# Patient Record
Sex: Female | Born: 1990 | Race: White | Hispanic: No | Marital: Married | Smoking: Never smoker
Health system: Southern US, Community
[De-identification: ages and names within clinical notes are randomized; demographics above are authoritative.]

---
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
## 2008-01-22 DIAGNOSIS — E739 Lactose intolerance, unspecified: Secondary | ICD-10-CM | POA: Insufficient documentation

## 2008-01-23 DIAGNOSIS — E876 Hypokalemia: Secondary | ICD-10-CM | POA: Insufficient documentation

## 2008-01-23 DIAGNOSIS — E538 Deficiency of other specified B group vitamins: Secondary | ICD-10-CM | POA: Insufficient documentation

## 2009-08-29 DIAGNOSIS — R200 Anesthesia of skin: Secondary | ICD-10-CM | POA: Insufficient documentation

## 2011-11-24 DIAGNOSIS — Z79899 Other long term (current) drug therapy: Secondary | ICD-10-CM | POA: Insufficient documentation

## 2011-11-24 DIAGNOSIS — R200 Anesthesia of skin: Secondary | ICD-10-CM | POA: Insufficient documentation

## 2012-03-21 DIAGNOSIS — I7300 Raynaud's syndrome without gangrene: Secondary | ICD-10-CM | POA: Insufficient documentation

## 2012-03-23 DIAGNOSIS — R739 Hyperglycemia, unspecified: Secondary | ICD-10-CM | POA: Insufficient documentation

## 2016-01-07 DIAGNOSIS — R5383 Other fatigue: Secondary | ICD-10-CM | POA: Insufficient documentation

## 2018-02-14 ENCOUNTER — Encounter: Payer: Self-pay | Admitting: Internal Medicine

## 2018-03-10 ENCOUNTER — Ambulatory Visit: Payer: Self-pay | Admitting: Internal Medicine

## 2018-04-28 ENCOUNTER — Ambulatory Visit: Payer: Self-pay | Admitting: Internal Medicine

## 2018-06-02 ENCOUNTER — Ambulatory Visit (INDEPENDENT_AMBULATORY_CARE_PROVIDER_SITE_OTHER): Payer: BLUE CROSS/BLUE SHIELD | Admitting: Internal Medicine

## 2018-06-02 ENCOUNTER — Encounter: Payer: Self-pay | Admitting: Internal Medicine

## 2018-06-02 ENCOUNTER — Other Ambulatory Visit: Payer: Self-pay

## 2018-06-02 VITALS — BP 110/60 | HR 77 | Temp 98.6°F | Ht 60.0 in | Wt 292.0 lb

## 2018-06-02 DIAGNOSIS — E282 Polycystic ovarian syndrome: Secondary | ICD-10-CM

## 2018-06-02 DIAGNOSIS — E039 Hypothyroidism, unspecified: Secondary | ICD-10-CM

## 2018-06-02 LAB — TSH: TSH: 0.58 u[IU]/mL (ref 0.35–4.50)

## 2018-06-02 LAB — T3, FREE: T3, Free: 3.2 pg/mL (ref 2.3–4.2)

## 2018-06-02 LAB — T4, FREE: Free T4: 0.79 ng/dL (ref 0.60–1.60)

## 2018-06-02 LAB — HEMOGLOBIN A1C: Hgb A1c MFr Bld: 5.2 % (ref 4.6–6.5)

## 2018-06-02 NOTE — Progress Notes (Signed)
Patient ID: Brittany Rush, female   DOB: [birth year], 28 y.o.   MRN: [id number]   HPI  Brittany Rush is a 28 y.o.-year-old female, referred by her PCP, Dr. [name], for management of hypothyroidism and PCOS. She saw another endocrinologist previously (Dr. Elbert Ewings)), at [facility].  Pt. has been dx with hypothyroidism in 2003 (age 9) >> on Levothyroxine 250 mcg 6/7 days, then 125 1/7 days - last dose change 12/2017.  She takes the thyroid hormone: - fasting - with water - separated by >30 min from b'fast and occas. Coffee (creamer) - no calcium, iron, PPIs, multivitamins  Of note, she has a history of lactose intolerance.  I reviewed pt's thyroid tests: 2020: TSH abnormal 08/12/2017: TSH 0.22 No results found for: TSH, FREET4, T3FREE  Pt describes: - weight gain - fatigue - cold intolerance - depression - constipation - dry skin - hair loss  Pt denies feeling nodules in neck, hoarseness, dysphagia/odynophagia, SOB with lying down.  She has + FH of thyroid disorders in: "every woman on mother's side". No FH of thyroid cancer.  No h/o radiation tx to head or neck. No recent use of iodine supplements.  She has a h/o fibromyalgia. On Neurontin.  Pt. also has a history of PCOS - dx at 28 y/o:  Fertility/Menstrual cycles: - + irregular menses - no h/o ovarian cysts - children: 0 (tried for 3 years) -she was told that she could not have children due to her PCOS.  Did not see reproductive endocrinology in the past. - miscarriages: 0 - contraception: tried OCPs >> did not work >> now NuVa RIng  Acne: - no   Hirsutism: - just on face >> sideburns and chin - plucking it  Weight gain: - lost few lbs: highest wt: 310 >> 288 at home now - now on the 21 day fixed diet  - no steroid use - + h/o weight loss meds: Victoza, Saxenda, Advocare, Phentermine - no success - Meals: - Breakfast: Boiled egg, small sausage patty/protein shake - Lunch: Meat, vegetable, starch - Dinner: Same  as lunch - Snacks: 2 snacks a day: Vegetables, fruit  - Diets tried: see above - Exercise: Walking  Treatments tried: - started Metformin 1000 mg at dinnertime  - started ~2010 - higher dose: N/D even with the ER formulation - started Spironolactone 100 mg daily ~ 2010 - did not try Vaniqa - not on OCPs  + FH of DM1 in [place]  ROS: Constitutional: + weight gain, + fatigue, no subjective hyperthermia/hypothermia Eyes: no blurry vision, no xerophthalmia ENT: no sore throat, no nodules palpated in throat, no dysphagia/odynophagia, no hoarseness Cardiovascular: no CP/SOB/palpitations/leg swelling Respiratory: no cough/SOB Gastrointestinal: no N/V/D/C/+ acid reflux - rarely  Musculoskeletal: + Both, + headaches muscle/joint aches Skin: no rashes, + hair loss, + excessive hair growth on face, + easy bruising Neurological: no tremors/numbness/tingling/dizziness Psychiatric: no depression/anxiety + Low libido  No past medical history on file.  Social History   Socioeconomic History  . Marital status: Married    Spouse name: Not on file  . Number of children: 0  . Years of education: Not on file  . Highest education level: Not on file  Occupational History  .  Administrative assistant  Social Needs  . Financial resource strain: Not on file  . Food insecurity:    Worry: Not on file    Inability: Not on file  . Transportation needs:    Medical: Not on file    Non-medical: Not on  file  Tobacco Use  . Smoking status: Never Smoker  . Smokeless tobacco: Never Used  Substance and Sexual Activity  . Alcohol use: Not on file  . Drug use: Not on file  . Sexual activity: Not on file  Lifestyle  . Physical activity:    Days per week: Not on file    Minutes per session: Not on file  . Stress: Not on file  Relationships  . Social connections:    Talks on phone: Not on file    Gets together: Not on file    Attends religious service: Not on file    Active member of club or  organization: Not on file    Attends meetings of clubs or organizations: Not on file    Relationship status: Not on file  . Intimate partner violence:    Fear of current or ex partner: Not on file    Emotionally abused: Not on file    Physically abused: Not on file    Forced sexual activity: Not on file  Other Topics Concern  . Not on file  Social History Narrative  . Not on file   Current Outpatient Medications on File Prior to Visit  Medication Sig Dispense Refill  . escitalopram (LEXAPRO) 20 MG tablet     . etonogestrel-ethinyl estradiol (NUVARING) 0.12-0.015 MG/24HR vaginal ring INSERT 1 RING VAGINALLY AS DIRECTED. REMOVE AFTER 3 WEEKS & WAIT 7 DAYS BEFORE INSERTING A NEW RING    . gabapentin (NEURONTIN) 600 MG tablet Take by mouth.    . levothyroxine (SYNTHROID) 125 MCG tablet 125 mcg.    . metFORMIN (GLUCOPHAGE) 500 MG tablet 1,000 mg. Take 1000 mg daily at night    . spironolactone (ALDACTONE) 100 MG tablet     . naproxen (NAPROSYN) 500 MG tablet naproxen 500 mg tablet  Take 1 tablet twice a day by oral route for 30 days.     No current facility-administered medications on file prior to visit.    No Known Allergies  Family history: + See HPI Diabetes in MGM, maternal aunt and paternal aunt HTN in maternal uncle and aunt HL in maternal and paternal uncles Heart disease in MGF, MGM, maternal uncle and aunt, paternal uncle Cancer in PGM (pancreas), MGM (lung)  PE: BP 110/60   Pulse 77   Temp 98.6 F (37 C)   Ht 5' (1.524 m)   Wt 292 lb (132.5 kg)   LMP 05/28/2018   SpO2 98%   BMI 57.03 kg/m  Wt Readings from Last 3 Encounters:  06/02/18 292 lb (132.5 kg)   Constitutional: overweight, in NAD Eyes: PERRLA, EOMI, no exophthalmos ENT: moist mucous membranes, no thyromegaly, no cervical lymphadenopathy Cardiovascular: RRR, No MRG Respiratory: CTA B Gastrointestinal: abdomen soft, NT, ND, BS+ Musculoskeletal: no deformities, strength intact in all 4 Skin: moist,  warm, no rashes, + thicker, terminal hair on chin Neurological: no tremor with outstretched hands, DTR normal in all 4  ASSESSMENT: 1. Hypothyroidism  2. PCOS  PLAN:  1. Patient with long-standing, uncontrolled, hypothyroidism, on high-dose levothyroxine therapy.  We discussed that patient with lactose intolerance may require higher doses of levothyroxine. - she appears euthyroid but complains of fatigue and inability to lose weight.  - she does not appear to have a goiter, thyroid nodules, or neck compression symptoms - We discussed about correct intake of levothyroxine, fasting, with water, separated by at least 30 minutes from breakfast, and separated by more than 4 hours from calcium, iron, multivitamins, acid  reflux medications (PPIs).  She is taking it correctly. - will check thyroid tests today: TSH, free T4, free T3, I will also screen for Hashimoto's thyroiditis by checking antithyroid antibodies.  I explained that a diagnosis of Hashimoto's thyroiditis would not necessarily change the treatment, but will help us understand why she has hypothyroidism in the first place.  Some patients with high antibodies may benefit from selenium supplementation.  We can try this if her antibodies are elevated. - We also discussed about ways to improve her autoimmunity by improving rest, body habitus, increasing exercise, reducing stress. - If labs today are abnormal, she will need to return in ~6 weeks for repeat labs - Otherwise, I will see her back in 6 months  2. PCOS I had a long discussion with the patient about the fact that the PCOS is a misnomer, a patient does not necessarily have to have polycystic ovaries to be diagnosed with the disorder. This is of sum of several conditions, including:  weight gain  insulin resistance (and therefore a higher risk of developing diabetes later in life)  acne  hirsutism  irregular menstrual cycles  decreased fertility.  However, I discussed that  today we have many possible treatments for infertility and PCOS patient can usually get pregnant but some may need help from fertility specialists.  She is not clear if she wants to pursue this.  She was tearful in the office today, discussed about this topic. - We also discussed about the fact that the treatment is usually targeted to addressing the problem that concerns the patient the most: acne/hirsutism, weight gain, or fertility, but there is no single treatment for PCOS.  - The first-line therapy are oral contraceptives. If she is concerned with her weight, we can use metformin; if she is concerned about acne/hirsutism, we can add spironolactone; and if she is concerned about fertility, I could refer her to reproductive endocrinology for possible use of clomiphene. In her case, she is already on NuvaRing, metformin and spironolactone and will continue this.  I advised her that she should not try to get pregnant while on spironolactone due to possible birth defects  -For now, I would like to check the following tests: Orders Placed This Encounter  Procedures  . Testosterone Free with SHBG  . Prolactin  . 17-Hydroxyprogesterone  . T4, free  . T3, free  . TSH  . DHEA-Sulfate, Serum  . Thyroglobulin antibody  . Thyroid peroxidase antibody  . Hemoglobin A1c  . COMPLETE METABOLIC PANEL WITH GFR   Office Visit on 06/02/2018  Component Date Value Ref Range Status  . Testosterone, Serum (Total) 06/02/2018 7.2  ng/dL Final   Comment: This test was developed and its performance characteristics determined by [facility]. It has not been cleared or approved by the Food and Drug Administration. Reference Range: Adult Females   Premenopausal  10 - 55   Postmenopausal  7 - 40   . % Free Testosterone 06/02/2018 0.4  % Final   Comment: This test was developed and its performance characteristics determined by [facility]. It has not been cleared or approved by the Food and Drug Administration. Reference  Range: Adult Females: 0.8 - 1.4   . Free Testosterone, S 06/02/2018 0.3* pg/mL Final   Comment: Reference Range: Adult Females: 1.1 - 6.3   . Sex Hormone Binding Globulin 06/02/2018 197.2* nmol/L Final   Comment: Reference Range: Pubertal: 36.0 - 125.0 20 - 49y: 24.6 - 122.0 >49y:     17.3 -  125.0   . Prolactin 06/02/2018 7.6  ng/mL Final   Comment:             Reference Range  Females         Non-pregnant        3.0-30.0         Pregnant           10.0-209.0         Postmenopausal      2.0-20.0 . . .   . 17-OH-Progesterone, LC/MS/MS 06/02/2018 <8  ng/dL Final   Comment: . Unable to flag abnormal result(s), please refer     to reference range(s) below: . Adult Female Reference Ranges   for 17-Hydroxyprogesterone: .   Pre-Menopausal Mid Follicular:  23 - 102 ng/dL   Pre-Menopausal Surge:           67 - 349 ng/dL   Pre-Menopausal Mid Luteal:     139 - 431 ng/dL   Postmenopausal Phase:          < or = 45 ng/dL .       Female Tanner Stages: .   II - III Females:  18 - 220 ng/dL   IV - V   Females:  36 - 200 ng/dL . Marland Kitchen **Includes data from J Clin Endocrinol Metab.   [phone]; J Clin Endocrinol Metab.   [phone]; J Clin Endocrinol Metab.   1994;78:226-270. Pediatr Res 1988;23:525-529.   MedLinePlus (accessed 07/10/12). . This test was developed and its analytical performance characteristics have been determined by [facility] [place], [place]. It has not been cleared or approved by the U.S. Food and Drug Administration. This assay has been validated pursuant to the CLIA regulations and is [place]                          ed for clinical purposes. .   . Free T4 06/02/2018 0.79  0.60 - 1.60 ng/dL Final   Comment: Specimens from patients who are undergoing biotin therapy and /or ingesting biotin supplements may contain high levels of biotin.  The higher biotin concentration in these specimens interferes with this Free T4 assay.   Specimens that contain high levels  of biotin may cause false high results for this Free T4 assay.  Please interpret results in light of the total clinical presentation of the patient.    . T3, Free 06/02/2018 3.2  2.3 - 4.2 pg/mL Final  . TSH 06/02/2018 0.58  0.35 - 4.50 uIU/mL Final  . DHEA-Sulfate, LCMS 06/02/2018 62  ug/dL Final   Comment: This test was developed and its performance characteristics determined by [facility]. It has not been cleared or approved by the Food and Drug Administration. Reference Range: Adult Females (21 - 30y): 22 - 372   . Thyroglobulin Ab 06/02/2018 3* < or = 1 IU/mL Final  . Thyroperoxidase Ab SerPl-aCnc 06/02/2018 71* <9 IU/mL Final  . Hgb A1c MFr Bld 06/02/2018 5.2  4.6 - 6.5 % Final   Glycemic Control Guidelines for People with Diabetes:Non Diabetic:  <6%Goal of Therapy: <7%Additional Action Suggested:  >8%   . Glucose, Bld 06/02/2018 115* 65 - 99 mg/dL Final   Comment: .            Fasting reference interval . For someone without known diabetes, a glucose value between 100 and 125 mg/dL is consistent with prediabetes and should be confirmed with a follow-up test. .   . BUN 06/02/2018  13  7 - 25 mg/dL Final  . Creat 65/78/4696 0.79  0.50 - 1.10 mg/dL Final  . GFR, Est Non African American 06/02/2018 101  > OR = 60 mL/min/1.70m2 Final  . GFR, Est African American 06/02/2018 117  > OR = 60 mL/min/1.67m2 Final  . BUN/Creatinine Ratio 06/02/2018 NOT APPLICABLE  6 - 22 (calc) Final  . Sodium 06/02/2018 136  135 - 146 mmol/L Final  . Potassium 06/02/2018 4.1  3.5 - 5.3 mmol/L Final  . Chloride 06/02/2018 102  98 - 110 mmol/L Final  . CO2 06/02/2018 22  20 - 32 mmol/L Final  . Calcium 06/02/2018 9.7  8.6 - 10.2 mg/dL Final  . Total Protein 06/02/2018 7.3  6.1 - 8.1 g/dL Final  . Albumin 29/52/8413 4.1  3.6 - 5.1 g/dL Final  . Globulin 24/40/1027 3.2  1.9 - 3.7 g/dL (calc) Final  . AG Ratio 06/02/2018 1.3  1.0 - 2.5 (calc) Final  . Total Bilirubin  06/02/2018 0.3  0.2 - 1.2 mg/dL Final  . Alkaline phosphatase (APISO) 06/02/2018 57  31 - 125 U/L Final  . AST 06/02/2018 12  10 - 30 U/L Final  . ALT 06/02/2018 12  6 - 29 U/L Final   Her tests are normal with the exception of antithyroid antibodies, which are elevated, confirming Hashimoto's thyroiditis.  I would suggest to start selenium 200 mcg daily.  We will recheck her thyroid antibodies at next visit.  Carlus Pavlov, MD PhD [facility] Endocrinology

## 2018-06-02 NOTE — Patient Instructions (Signed)
Please stop at the lab.  Continue: - Metformin 1000 mg with dinner - Spironolactone 100 mg at bedtime  Please come back for a follow-up appointment in 6 months.

## 2018-06-06 LAB — COMPLETE METABOLIC PANEL WITH GFR
AG Ratio: 1.3 (calc) (ref 1.0–2.5)
ALT: 12 U/L (ref 6–29)
AST: 12 U/L (ref 10–30)
Albumin: 4.1 g/dL (ref 3.6–5.1)
Alkaline phosphatase (APISO): 57 U/L (ref 31–125)
BUN: 13 mg/dL (ref 7–25)
CO2: 22 mmol/L (ref 20–32)
Calcium: 9.7 mg/dL (ref 8.6–10.2)
Chloride: 102 mmol/L (ref 98–110)
Creat: 0.79 mg/dL (ref 0.50–1.10)
GFR, Est African American: 117 mL/min/{1.73_m2} (ref >=60)
GFR, Est Non African American: 101 mL/min/{1.73_m2} (ref >=60)
Globulin: 3.2 g/dL (calc) (ref 1.9–3.7)
Glucose, Bld: 115 mg/dL — ABNORMAL HIGH (ref 65–99)
Potassium: 4.1 mmol/L (ref 3.5–5.3)
Sodium: 136 mmol/L (ref 135–146)
Total Bilirubin: 0.3 mg/dL (ref 0.2–1.2)
Total Protein: 7.3 g/dL (ref 6.1–8.1)

## 2018-06-06 LAB — PROLACTIN: Prolactin: 7.6 ng/mL

## 2018-06-06 LAB — TESTOSTERONE, FREE AND TOTAL (INCLUDES SHBG)-(MALES)
% Free Testosterone: 0.4 %
Free Testosterone, S: 0.3 pg/mL — ABNORMAL LOW
Sex Hormone Binding Globulin: 197.2 nmol/L — ABNORMAL HIGH
Testosterone, Serum (Total): 7.2 ng/dL

## 2018-06-06 LAB — THYROGLOBULIN ANTIBODY: Thyroglobulin Ab: 3 IU/mL — ABNORMAL HIGH (ref <=1)

## 2018-06-06 LAB — 17-HYDROXYPROGESTERONE: 17-OH-Progesterone, LC/MS/MS: <8 ng/dL

## 2018-06-06 LAB — THYROID PEROXIDASE ANTIBODY: Thyroperoxidase Ab SerPl-aCnc: 71 IU/mL — ABNORMAL HIGH (ref <9)

## 2018-06-06 LAB — DHEA-SULFATE, SERUM: DHEA-Sulfate, LCMS: 62 ug/dL

## 2018-06-07 DIAGNOSIS — E039 Hypothyroidism, unspecified: Secondary | ICD-10-CM | POA: Insufficient documentation

## 2018-06-07 DIAGNOSIS — E282 Polycystic ovarian syndrome: Secondary | ICD-10-CM | POA: Insufficient documentation

## 2018-06-08 DIAGNOSIS — E063 Autoimmune thyroiditis: Secondary | ICD-10-CM | POA: Insufficient documentation

## 2018-06-15 ENCOUNTER — Encounter: Payer: Self-pay | Admitting: Internal Medicine

## 2018-06-16 MED ORDER — METFORMIN HCL 500 MG PO TABS: ORAL_TABLET | ORAL | 2 refills | Status: DC

## 2018-07-06 ENCOUNTER — Telehealth: Payer: Self-pay | Admitting: Internal Medicine

## 2018-07-06 MED ORDER — SPIRONOLACTONE 100 MG PO TABS: ORAL_TABLET | ORAL | 1 refills | Status: DC

## 2018-07-06 NOTE — Telephone Encounter (Signed)
RX sent

## 2018-07-06 NOTE — Telephone Encounter (Signed)
MEDICATION: Spironolactin  PHARMACY:  [facility] on [address] in [place] : 90 day if possible  IS PATIENT OUT OF MEDICATION: yes  IF NOT; HOW MUCH IS LEFT:   LAST APPOINTMENT DATE: @5 /08/2018  NEXT APPOINTMENT DATE:@11 /09/2018  DO WE HAVE YOUR PERMISSION TO LEAVE A DETAILED MESSAGE: yes, [phone]  OTHER COMMENTS:    **Let patient know to contact pharmacy at the end of the day to make sure medication is ready. **  ** Please notify patient to allow 48-72 hours to process**  **Encourage patient to contact the pharmacy for refills or they can request refills through [facility]**

## 2018-11-10 ENCOUNTER — Other Ambulatory Visit: Payer: Self-pay | Admitting: Internal Medicine

## 2018-12-04 ENCOUNTER — Ambulatory Visit: Payer: BLUE CROSS/BLUE SHIELD | Admitting: Internal Medicine

## 2019-01-11 ENCOUNTER — Other Ambulatory Visit: Payer: Self-pay | Admitting: Internal Medicine

## 2019-02-16 ENCOUNTER — Other Ambulatory Visit: Payer: Self-pay | Admitting: Internal Medicine

## 2019-03-07 ENCOUNTER — Ambulatory Visit: Payer: BLUE CROSS/BLUE SHIELD | Admitting: Internal Medicine

## 2019-03-08 ENCOUNTER — Ambulatory Visit (INDEPENDENT_AMBULATORY_CARE_PROVIDER_SITE_OTHER): Payer: Managed Care, Other (non HMO) | Admitting: Internal Medicine

## 2019-03-08 ENCOUNTER — Encounter: Payer: Self-pay | Admitting: Internal Medicine

## 2019-03-08 ENCOUNTER — Other Ambulatory Visit: Payer: Self-pay

## 2019-03-08 DIAGNOSIS — E038 Other specified hypothyroidism: Secondary | ICD-10-CM | POA: Diagnosis not present

## 2019-03-08 DIAGNOSIS — E063 Autoimmune thyroiditis: Secondary | ICD-10-CM

## 2019-03-08 DIAGNOSIS — E282 Polycystic ovarian syndrome: Secondary | ICD-10-CM

## 2019-03-08 DIAGNOSIS — E039 Hypothyroidism, unspecified: Secondary | ICD-10-CM | POA: Diagnosis not present

## 2019-03-08 NOTE — Progress Notes (Signed)
Patient ID: Brittany Rush, female   DOB: 1990/07/10, 29 y.o.   MRN: [id number]   Patient location: in car My location: Office Persons participating in the virtual visit: patient, provider  Referring Provider: Kathlee Nations, MD  I connected with the patient on 03/08/19 at  7:58 11 oh AM EST by a video enabled telemedicine application and verified that I am speaking with the correct person.   I discussed the limitations of evaluation and management by telemedicine and the availability of in person appointments. The patient expressed understanding and agreed to proceed.   Details of the encounter are shown below.  HPI  Brittany Rush is a 29 y.o.-year-old female, returning for follow-up for hypothyroidism and PCOS.  Last visit 9 months ago. She saw another endocrinologist previously (Dr. Elbert Ewings), at [facility].  Hypothyroidism in 2003 (age 29) >> on levothyroxine.  At last visit, we diagnosed Hashimoto's thyroiditis based on elevated antithyroid antibodies.  Pt is on levothyroxine 125 mcg daily, taken: - in am - fasting - at least 30 min from b'fast - no Ca, Fe, MVI, PPIs - not on Biotin She has a history of lactose intolerance.  I reviewed patient's TFTs: Lab Results  Component Value Date   TSH 0.58 06/02/2018   FREET4 0.79 06/02/2018   T3FREE 3.2 06/02/2018  2020: TSH abnormal 08/12/2017: TSH 0.22  Component     Latest Ref Rng & Units 06/02/2018  Thyroglobulin Ab     < or = 1 IU/mL 3 (H)  Thyroperoxidase Ab SerPl-aCnc     <9 IU/mL 71 (H)   We started selenium 200 mcg daily after the above results returned.  At last visit, she c/o: - Weight gain - Fatigue - Cold intolerance - Constipation - Hair loss - Dry skin She kept her weight stable since last OV>  Pt denies: - feeling nodules in neck - hoarseness - dysphagia - choking - SOB with lying down  She has + FH of thyroid disorders in: "every woman on mother's side".  No family history of thyroid cancer. No history  of radiation therapy to head or neck. No recent use of iodine supplements.  She has a history of fibromyalgia.  On Neurontin.  PCOS  -Diagnosed at 29 years old  Labs checked at last visit ruled out CAH, hyperprolactinemia.  Testosterone level was controlled: Component     Latest Ref Rng & Units 06/02/2018  Testosterone, Serum (Total)     ng/dL 7.2  % Free Testosterone     % 0.4  Free Testosterone, S     pg/mL 0.3 (L)  Sex Hormone Binding Globulin     nmol/L 197.2 (H)  Prolactin     ng/mL 7.6  17-OH-Progesterone, LC/MS/MS     *ng/dL <8  DHEA-Sulfate, LCMS     ug/dL 62   Fertility/Menstrual cycles: - + Irregular menses - no h/o ovarian cysts - children: 0 (tried for 3 years) -she was told that she could not have children due to her PCOS.  She did not see reproductive endocrinology. - miscarriages: 0 - contraception: tried OCPs >> did not work >>  now on NuvaRing  Acne: - no  Hirsutism: - just on face >> sideburns and chin - plucks it  Weight gain: - lost few lbs: highest wt: 310 >> 288 lbs at last OV >> 290 lbs 2 mo ago - At last visit she was on the 21-day fixed diet - no steroid use - She used weight loss medications in the past:  Victoza, Saxenda, AdvoCare, phentermine, without success - + h/o weight loss meds: Victoza, Saxenda, Advocare, Phentermine - no success - Meals: - Breakfast: Boiled egg, small sausage patty/protein shake - Lunch: Meat, vegetable, starch - Dinner: Same as lunch - Snacks: 2 snacks a day: Vegetables, fruit and probably - Diets tried: see above - Exercise: Walking  Treatments tried: - started Metformin 1000 mg at dinnertime  - started ~2010 -she had nausea and diarrhea with higher doses given in the ER formulation - Spironolactone 100 mg daily-started ~2010 - did not try Vaniqa - Not on OCPs  + FH of DM1 in GM  ROS: Constitutional: no weight gain/no weight loss, no fatigue, no subjective hyperthermia, no subjective hypothermia Eyes:  no blurry vision, no xerophthalmia ENT: no sore throat, no nodules palpated in neck, no dysphagia, no odynophagia, no hoarseness Cardiovascular: no CP/no SOB/no palpitations/no leg swelling Respiratory: no cough/no SOB/no wheezing Gastrointestinal: no N/no V/no D/no C/no acid reflux Musculoskeletal:+ muscle aches/+ joint aches Skin: no rashes, + hair loss, + excessive hair growth on face Neurological: no tremors/no numbness/no tingling/no dizziness, + HAs  I reviewed pt's medications, allergies, PMH, social hx, family hx, and changes were documented in the history of present illness. Otherwise, unchanged from my initial visit note.  PMH: - see HPI  Social History   Socioeconomic History  . Marital status: Married    Spouse name: Not on file  . Number of children: 0  . Years of education: Not on file  . Highest education level: Not on file  Occupational History  .  Administrative assistant  Social Needs  . Financial resource strain: Not on file  . Food insecurity:    Worry: Not on file    Inability: Not on file  . Transportation needs:    Medical: Not on file    Non-medical: Not on file  Tobacco Use  . Smoking status: Never Smoker  . Smokeless tobacco: Never Used  Substance and Sexual Activity  . Alcohol use: Not on file  . Drug use: Not on file  . Sexual activity: Not on file  Lifestyle  . Physical activity:    Days per week: Not on file    Minutes per session: Not on file  . Stress: Not on file  Relationships  . Social connections:    Talks on phone: Not on file    Gets together: Not on file    Attends religious service: Not on file    Active member of club or organization: Not on file    Attends meetings of clubs or organizations: Not on file    Relationship status: Not on file  . Intimate partner violence:    Fear of current or ex partner: Not on file    Emotionally abused: Not on file    Physically abused: Not on file    Forced sexual activity: Not on file   Other Topics Concern  . Not on file  Social History Narrative  . Not on file   Current Outpatient Medications on File Prior to Visit  Medication Sig Dispense Refill  . escitalopram (LEXAPRO) 20 MG tablet     . etonogestrel-ethinyl estradiol (NUVARING) 0.12-0.015 MG/24HR vaginal ring INSERT 1 RING VAGINALLY AS DIRECTED. REMOVE AFTER 3 WEEKS & WAIT 7 DAYS BEFORE INSERTING A NEW RING    . gabapentin (NEURONTIN) 600 MG tablet Take by mouth.    . levothyroxine (SYNTHROID) 125 MCG tablet 125 mcg.    . metFORMIN (GLUCOPHAGE) 500 MG tablet  TAKE 2 TABLETS BY MOUTH NIGHTLY 60 tablet 8  . naproxen (NAPROSYN) 500 MG tablet naproxen 500 mg tablet  Take 1 tablet twice a day by oral route for 30 days.    Marland Kitchen spironolactone (ALDACTONE) 100 MG tablet TAKE 1 TABLET BY MOUTH EVERY DAY 90 tablet 1   No current facility-administered medications on file prior to visit.   No Known Allergies  Family history: + See HPI Diabetes in MGM, maternal aunt and paternal aunt HTN in maternal uncle and aunt HL in maternal and paternal uncles Heart disease in MGF, MGM, maternal uncle and aunt, paternal uncle Cancer in [place] (pancreas), MGM (lung)  PE: There were no vitals taken for this visit. Wt Readings from Last 3 Encounters:  06/02/18 292 lb (132.5 kg)   Constitutional:  in NAD  The physical exam was not performed (virtual visit).  ASSESSMENT: 1. Hypothyroidism -Due to Hashimoto's thyroiditis  2. PCOS  PLAN:  1. Patient with longstanding, uncontrolled, hypothyroidism - latest thyroid labs reviewed with pt >> normal: Lab Results  Component Value Date   TSH 0.58 06/02/2018   - she continues on LT4 125 mcg daily - pt feels good on this dose, but continues to have nonspecific symptoms (please see HPI). - We discussed about her diagnosis of Hashimoto's thyroiditis made at last visit based on elevated antithyroid antibodies.  We started selenium 200 mcg daily in an effort to decrease her antibodies.  She  continues on this now.  We will check the antibodies when she returns to the clinic - we discussed about taking the thyroid hormone every day, with water, >30 minutes before breakfast, separated by >4 hours from acid reflux medications, calcium, iron, multivitamins. Pt. is taking it correctly. - will check thyroid tests when she returns to the clinic: TSH and fT4 - If labs are abnormal, she will need to return for repeat TFTs in 1.5 months  2. PCOS -Patient with long history of PCOS and several symptoms that the line with the condition including weight gain, fatigue, irregular menstrual cycles and decreased fertility. -She continues on Metformin 1000 mg daily.  She tolerates these well, but she could not tolerate higher doses. -She is on NuvaRing.  No plans for pregnancy for now. -She is also on spironolactone 100 mg daily, which she tolerates well, without dizziness or orthostasis. -At this visit, will order another testosterone, CMP, HbA1c -we will check these when she can return to the clinic.  Now coronavirus pandemic. Orders Placed This Encounter  Procedures  . T4, free  . TSH  . Hemoglobin A1c  . Comprehensive metabolic panel  . Thyroid peroxidase antibody  . Thyroglobulin antibody  - will see her back in 1 year  Philemon Kingdom, MD PhD [facility] Endocrinology

## 2019-03-08 NOTE — Patient Instructions (Signed)
Please continue: - Metformin 1000 mg with dinner - Spironolactone 100 mg at bedtime  Please come back for a follow-up appointment in 1 year, but please come at your convenience for labs.  

## 2019-03-16 ENCOUNTER — Other Ambulatory Visit: Payer: Managed Care, Other (non HMO)

## 2019-03-23 ENCOUNTER — Other Ambulatory Visit: Payer: Managed Care, Other (non HMO)

## 2019-03-26 ENCOUNTER — Other Ambulatory Visit: Payer: Managed Care, Other (non HMO)

## 2019-05-18 ENCOUNTER — Other Ambulatory Visit: Payer: Self-pay

## 2019-05-18 ENCOUNTER — Other Ambulatory Visit (INDEPENDENT_AMBULATORY_CARE_PROVIDER_SITE_OTHER): Payer: 59

## 2019-05-18 DIAGNOSIS — E063 Autoimmune thyroiditis: Secondary | ICD-10-CM | POA: Diagnosis not present

## 2019-05-18 DIAGNOSIS — E282 Polycystic ovarian syndrome: Secondary | ICD-10-CM | POA: Diagnosis not present

## 2019-05-18 DIAGNOSIS — E038 Other specified hypothyroidism: Secondary | ICD-10-CM | POA: Diagnosis not present

## 2019-05-18 LAB — COMPREHENSIVE METABOLIC PANEL
ALT: 12 U/L (ref 0–35)
AST: 13 U/L (ref 0–37)
Albumin: 3.9 g/dL (ref 3.5–5.2)
Alkaline Phosphatase: 55 U/L (ref 39–117)
BUN: 15 mg/dL (ref 6–23)
CO2: 26 mEq/L (ref 19–32)
Calcium: 9.3 mg/dL (ref 8.4–10.5)
Chloride: 102 mEq/L (ref 96–112)
Creatinine, Ser: 0.86 mg/dL (ref 0.40–1.20)
GFR: 77 mL/min (ref >60.00)
Glucose, Bld: 88 mg/dL (ref 70–99)
Potassium: 4.2 mEq/L (ref 3.5–5.1)
Sodium: 137 mEq/L (ref 135–145)
Total Bilirubin: 0.4 mg/dL (ref 0.2–1.2)
Total Protein: 7.4 g/dL (ref 6.0–8.3)

## 2019-05-18 LAB — HEMOGLOBIN A1C: Hgb A1c MFr Bld: 5.2 % (ref 4.6–6.5)

## 2019-05-18 LAB — T4, FREE: Free T4: 0.95 ng/dL (ref 0.60–1.60)

## 2019-05-18 LAB — TSH: TSH: 14.02 u[IU]/mL — ABNORMAL HIGH (ref 0.35–4.50)

## 2019-05-21 ENCOUNTER — Other Ambulatory Visit: Payer: Self-pay | Admitting: Internal Medicine

## 2019-05-21 ENCOUNTER — Encounter: Payer: Self-pay | Admitting: Internal Medicine

## 2019-05-21 DIAGNOSIS — E039 Hypothyroidism, unspecified: Secondary | ICD-10-CM

## 2019-05-21 LAB — THYROGLOBULIN ANTIBODY: Thyroglobulin Ab: 3 IU/mL — ABNORMAL HIGH (ref <=1)

## 2019-05-21 LAB — THYROID PEROXIDASE ANTIBODY: Thyroperoxidase Ab SerPl-aCnc: 233 IU/mL — ABNORMAL HIGH (ref <9)

## 2019-07-03 ENCOUNTER — Encounter: Payer: Self-pay | Admitting: Internal Medicine

## 2019-07-20 ENCOUNTER — Other Ambulatory Visit (INDEPENDENT_AMBULATORY_CARE_PROVIDER_SITE_OTHER): Payer: 59

## 2019-07-20 ENCOUNTER — Other Ambulatory Visit: Payer: Self-pay

## 2019-07-20 DIAGNOSIS — E039 Hypothyroidism, unspecified: Secondary | ICD-10-CM | POA: Diagnosis not present

## 2019-07-20 LAB — TSH: TSH: 0.6 u[IU]/mL (ref 0.35–4.50)

## 2019-07-20 LAB — T4, FREE: Free T4: 1.15 ng/dL (ref 0.60–1.60)

## 2019-10-15 ENCOUNTER — Other Ambulatory Visit: Payer: Self-pay | Admitting: Internal Medicine

## 2019-10-26 ENCOUNTER — Other Ambulatory Visit: Payer: Self-pay | Admitting: Internal Medicine

## 2019-10-26 MED ORDER — LEVOTHYROXINE SODIUM 125 MCG PO TABS: 125.0000 ug | ORAL_TABLET | Freq: Every day | ORAL | 1 refills | Status: DC

## 2019-10-26 NOTE — Telephone Encounter (Signed)
Medication Refill Request  Did you call your pharmacy and request this refill first? YES  . If patient has not contacted pharmacy first, instruct them to do so for future refills.  . Remind them that contacting the pharmacy for their refill is the quickest method to get the refill.  . Refill policy also stated that it will take anywhere between 24-72 hours to receive the refill.    Name of medication? levothyroxine (SYNTHROID) 125 MCG tablet  Is this a 90 day supply? Unknown  Name and location of pharmacy? [facility] at [address] [place], [place]

## 2019-10-26 NOTE — Telephone Encounter (Signed)
RX sent

## 2020-01-30 ENCOUNTER — Other Ambulatory Visit: Payer: Self-pay | Admitting: Internal Medicine

## 2020-03-25 ENCOUNTER — Other Ambulatory Visit: Payer: Self-pay | Admitting: Internal Medicine

## 2020-04-18 ENCOUNTER — Other Ambulatory Visit: Payer: Self-pay | Admitting: Internal Medicine

## 2020-07-07 DIAGNOSIS — E669 Obesity, unspecified: Secondary | ICD-10-CM | POA: Diagnosis not present

## 2020-07-07 DIAGNOSIS — Z20822 Contact with and (suspected) exposure to covid-19: Secondary | ICD-10-CM | POA: Diagnosis not present

## 2020-07-07 DIAGNOSIS — U071 COVID-19: Secondary | ICD-10-CM | POA: Diagnosis not present

## 2020-07-21 ENCOUNTER — Other Ambulatory Visit: Payer: Self-pay | Admitting: Internal Medicine

## 2020-08-04 ENCOUNTER — Telehealth: Payer: Self-pay | Admitting: Internal Medicine

## 2020-08-04 DIAGNOSIS — E039 Hypothyroidism, unspecified: Secondary | ICD-10-CM

## 2020-08-04 DIAGNOSIS — E282 Polycystic ovarian syndrome: Secondary | ICD-10-CM

## 2020-08-04 MED ORDER — METFORMIN HCL 500 MG PO TABS: ORAL_TABLET | ORAL | 0 refills | Status: DC

## 2020-08-04 NOTE — Telephone Encounter (Signed)
Rx sent to preferred pharmacy.

## 2020-08-04 NOTE — Telephone Encounter (Signed)
Pt called to book an appt and request a refill for her Metformin. Please send to:  [id number] - [place], [place] - [address] Phone:  [phone]  Fax:  [phone]

## 2020-09-11 ENCOUNTER — Other Ambulatory Visit: Payer: Self-pay | Admitting: Internal Medicine

## 2020-09-11 DIAGNOSIS — E282 Polycystic ovarian syndrome: Secondary | ICD-10-CM

## 2020-09-15 ENCOUNTER — Other Ambulatory Visit: Payer: Self-pay | Admitting: Internal Medicine

## 2020-09-15 DIAGNOSIS — E282 Polycystic ovarian syndrome: Secondary | ICD-10-CM

## 2020-09-19 DIAGNOSIS — N390 Urinary tract infection, site not specified: Secondary | ICD-10-CM | POA: Diagnosis not present

## 2020-10-02 ENCOUNTER — Other Ambulatory Visit: Payer: Self-pay | Admitting: Internal Medicine

## 2020-10-02 DIAGNOSIS — E282 Polycystic ovarian syndrome: Secondary | ICD-10-CM

## 2020-10-07 ENCOUNTER — Other Ambulatory Visit: Payer: Self-pay | Admitting: Internal Medicine

## 2020-10-14 ENCOUNTER — Encounter: Payer: Self-pay | Admitting: Internal Medicine

## 2020-10-14 ENCOUNTER — Other Ambulatory Visit: Payer: Self-pay

## 2020-10-14 ENCOUNTER — Ambulatory Visit: Payer: BC Managed Care – PPO | Admitting: Internal Medicine

## 2020-10-14 VITALS — BP 122/82 | HR 77 | Ht 60.0 in | Wt 311.4 lb

## 2020-10-14 DIAGNOSIS — E282 Polycystic ovarian syndrome: Secondary | ICD-10-CM

## 2020-10-14 DIAGNOSIS — E063 Autoimmune thyroiditis: Secondary | ICD-10-CM

## 2020-10-14 DIAGNOSIS — E038 Other specified hypothyroidism: Secondary | ICD-10-CM | POA: Diagnosis not present

## 2020-10-14 DIAGNOSIS — E039 Hypothyroidism, unspecified: Secondary | ICD-10-CM

## 2020-10-14 LAB — COMPREHENSIVE METABOLIC PANEL
ALT: 11 U/L (ref 0–35)
AST: 12 U/L (ref 0–37)
Albumin: 3.9 g/dL (ref 3.5–5.2)
Alkaline Phosphatase: 53 U/L (ref 39–117)
BUN: 8 mg/dL (ref 6–23)
CO2: 23 mEq/L (ref 19–32)
Calcium: 9.2 mg/dL (ref 8.4–10.5)
Chloride: 104 mEq/L (ref 96–112)
Creatinine, Ser: 0.8 mg/dL (ref 0.40–1.20)
GFR: 97.55 mL/min (ref >60.00)
Glucose, Bld: 79 mg/dL (ref 70–99)
Potassium: 3.8 mEq/L (ref 3.5–5.1)
Sodium: 138 mEq/L (ref 135–145)
Total Bilirubin: 0.4 mg/dL (ref 0.2–1.2)
Total Protein: 7.6 g/dL (ref 6.0–8.3)

## 2020-10-14 LAB — LIPID PANEL
Cholesterol: 200 mg/dL (ref 0–200)
HDL: 56.5 mg/dL (ref >39.00)
LDL Cholesterol: 104 mg/dL — ABNORMAL HIGH (ref 0–99)
NonHDL: 143.7
Total CHOL/HDL Ratio: 4
Triglycerides: 198 mg/dL — ABNORMAL HIGH (ref 0.0–149.0)
VLDL: 39.6 mg/dL (ref 0.0–40.0)

## 2020-10-14 LAB — T4, FREE: Free T4: 0.79 ng/dL (ref 0.60–1.60)

## 2020-10-14 LAB — POCT GLYCOSYLATED HEMOGLOBIN (HGB A1C): Hemoglobin A1C: 4.9 % (ref 4.0–5.6)

## 2020-10-14 LAB — VITAMIN D 25 HYDROXY (VIT D DEFICIENCY, FRACTURES): VITD: 33.82 ng/mL (ref 30.00–100.00)

## 2020-10-14 LAB — TSH: TSH: 11.06 u[IU]/mL — ABNORMAL HIGH (ref 0.35–5.50)

## 2020-10-14 MED ORDER — SPIRONOLACTONE 100 MG PO TABS: 100.0000 mg | ORAL_TABLET | Freq: Every day | ORAL | 3 refills | Status: DC

## 2020-10-14 MED ORDER — METFORMIN HCL 1000 MG PO TABS: ORAL_TABLET | ORAL | 3 refills | Status: DC

## 2020-10-14 NOTE — Progress Notes (Signed)
StopPatient ID: Mikael Spray, female   DOB: 1990-03-01, 30 y.o.   MRN: [id number]   This visit occurred during the SARS-CoV-2 public health emergency.  Safety protocols were in place, including screening questions prior to the visit, additional usage of staff PPE, and extensive cleaning of exam room while observing appropriate contact time as indicated for disinfecting solutions.   HPI  Lanique Gonzalo is a 30 y.o.-year-old female, returning for follow-up for hypothyroidism and PCOS.  Last visit 1 year and 7 months ago (virtual) She saw another endocrinologist previously (Dr. Elbert Ewings), at [facility]. Lives [place] min away - [place].  Interim history: She has hair loss, fatigue and weight gain despite dieting in the last 3 months. She had Covid 19 in 06/2020.  Hypothyroidism  - dx. in 2003 (age 51) - we diagnosed Hashimoto's thyroiditis based on elevated antithyroid antibodies in 2020  Pt is on levothyroxine 125 mcg daily, taken: - in am - fasting - at least 30 min from b'fast - no Ca, Fe, MVI, PPIs - not on Biotin She has a history of lactose intolerance.  I reviewed patient's TFTs: Lab Results  Component Value Date   TSH 0.60 07/20/2019   TSH 14.02 (H) 05/18/2019   TSH 0.58 06/02/2018   FREET4 1.15 07/20/2019   FREET4 0.95 05/18/2019   FREET4 0.79 06/02/2018   T3FREE 3.2 06/02/2018  2020: TSH abnormal 08/12/2017: TSH 0.22  Component     Latest Ref Rng & Units 06/02/2018 05/18/2019  Thyroglobulin Ab     < or = 1 IU/mL 3 (H) 3 (H)  Thyroperoxidase Ab SerPl-aCnc     <9 IU/mL 71 (H) 233 (H)   We started selenium 200 mcg daily in 2020.  She continues on this.  Pt denies: - feeling nodules in neck - hoarseness - dysphagia - choking - SOB with lying down  She has + FH of thyroid disorders in: "every woman on mother's side".  No family history of thyroid cancer. No history of radiation therapy to head or neck. No recent use of iodine supplements.  She has a history of  fibromyalgia.  On Neurontin.  PCOS  -Diagnosed at 30 years old  Labs checked at last visit ruled out CAH, hyperprolactinemia.  Testosterone level was controlled: Component     Latest Ref Rng & Units 06/02/2018  Testosterone, Serum (Total)     ng/dL 7.2  % Free Testosterone     % 0.4  Free Testosterone, S     pg/mL 0.3 (L)  Sex Hormone Binding Globulin     nmol/L 197.2 (H)  Prolactin     ng/mL 7.6  17-OH-Progesterone, LC/MS/MS     *ng/dL <8  DHEA-Sulfate, LCMS     ug/dL 62   Lab Results  Component Value Date   HGBA1C 5.2 05/18/2019   HGBA1C 5.2 06/02/2018   Fertility/Menstrual cycles: - + Irregular menses - no h/o ovarian cysts - children: 0 (tried for 3 years) -she was told that she could not have children due to her PCOS (!).  She did not see reproductive endocrinology. - miscarriages: 0 - contraception: tried OCPs >> did not work >> on NuvaRing  Acne: - no  Hirsutism: - just on face >> sideburns and chin - plucks it  Weight gain: - highest wt: 310 >> 288 lbs at last OV >> 290 lbs >> 311 lbs - prev. on the 21-day fixed diet - no steroid use - She used weight loss medications in the past: Victoza, Saxenda, AdvoCare,  phentermine, without success - Meals: - Breakfast: Boiled egg, small sausage patty/protein shake >> Protein bar or apple - Lunch: Meat, vegetable, starch >> salad - Dinner: Same as lunch >> home cooked mea: chicken + green beans, baked potato - Snacks: 2 snacks a day: Vegetables, fruit and probably - Diets tried: see above - Exercise: Walking  Treatments tried: - on Metformin 1000 mg at dinnertime  - started ~2010 -she had nausea and diarrhea with higher doses even in the ER formulation - on Spironolactone 100 mg daily-started ~2010 - did not try Vaniqa - on NuvaRing as mentioned above  + FH of DM1 in GM  ROS: + Excessive hair on face, + see HPI  I reviewed pt's medications, allergies, PMH, social hx, family hx, and changes were documented  in the history of present illness. Otherwise, unchanged from my initial visit note.  PMH: - see HPI  Social History   Socioeconomic History   Marital status: Married    Spouse name: Not on file   Number of children: 0   Years of education: Not on file   Highest education level: Not on file  Occupational History    Environmental health practitioner  Social Needs   Financial resource strain: Not on file   Food insecurity:    Worry: Not on file    Inability: Not on file   Transportation needs:    Medical: Not on file    Non-medical: Not on file  Tobacco Use   Smoking status: Never Smoker   Smokeless tobacco: Never Used  Substance and Sexual Activity   Alcohol use: Not on file   Drug use: Not on file   Sexual activity: Not on file  Lifestyle   Physical activity:    Days per week: Not on file    Minutes per session: Not on file   Stress: Not on file  Relationships   Social connections:    Talks on phone: Not on file    Gets together: Not on file    Attends religious service: Not on file    Active member of club or organization: Not on file    Attends meetings of clubs or organizations: Not on file    Relationship status: Not on file   Intimate partner violence:    Fear of current or ex partner: Not on file    Emotionally abused: Not on file    Physically abused: Not on file    Forced sexual activity: Not on file  Other Topics Concern   Not on file  Social History Narrative   Not on file   Current Outpatient Medications on File Prior to Visit  Medication Sig Dispense Refill   escitalopram (LEXAPRO) 20 MG tablet      etonogestrel-ethinyl estradiol (NUVARING) 0.12-0.015 MG/24HR vaginal ring INSERT 1 RING VAGINALLY AS DIRECTED. REMOVE AFTER 3 WEEKS & WAIT 7 DAYS BEFORE INSERTING A NEW RING     gabapentin (NEURONTIN) 600 MG tablet Take by mouth.     levothyroxine (SYNTHROID) 125 MCG tablet TAKE 1 TABLET BY MOUTH EVERY DAY BEFORE BREAKFAST 30 tablet 0   metFORMIN (GLUCOPHAGE)  500 MG tablet TAKE 2 TABLETS BY MOUTH EVERY NIGHT 60 tablet 0   naproxen (NAPROSYN) 500 MG tablet naproxen 500 mg tablet  Take 1 tablet twice a day by oral route for 30 days.     spironolactone (ALDACTONE) 100 MG tablet TAKE 1 TABLET BY MOUTH EVERY DAY 30 tablet 0   No current facility-administered medications on  file prior to visit.   No Known Allergies  Family history: + See HPI Diabetes in MGM, maternal aunt and paternal aunt HTN in maternal uncle and aunt HL in maternal and paternal uncles Heart disease in MGF, MGM, maternal uncle and aunt, paternal uncle Cancer in PGM (pancreas), MGM (lung)  PE: BP 122/82 (BP Location: Left Wrist, Patient Position: Sitting, Cuff Size: Normal)   Pulse 77   Ht 5' (1.524 m)   Wt (!) 311 lb 6.4 oz (141.3 kg)   SpO2 96%   BMI 60.82 kg/m  Wt Readings from Last 3 Encounters:  10/14/20 (!) 311 lb 6.4 oz (141.3 kg)  06/02/18 292 lb (132.5 kg)   Constitutional: obese, in NAD Eyes: PERRLA, EOMI, no exophthalmos ENT: moist mucous membranes, no thyromegaly, no cervical lymphadenopathy Cardiovascular: RRR, No MRG, + B pitting edema bilateral legs Respiratory: CTA B Gastrointestinal: abdomen soft, NT, ND, BS+ Musculoskeletal: no deformities, strength intact in all 4 Skin: moist, warm, no rashes, + terminal hair on chin - plucked Neurological: no tremor with outstretched hands, DTR normal in all 4  ASSESSMENT: 1. Hypothyroidism -Due to Hashimoto's thyroiditis  2. PCOS  PLAN:  1. Patient with longstanding, previously uncontrolled hypothyroidism - latest thyroid labs reviewed with pt. >> normal: Lab Results  Component Value Date   TSH 0.60 07/20/2019  - she continues on LT4 125 mcg daily and selenium 200 mcg daily - pt feels good on this dose. - we discussed about taking the thyroid hormone every day, with water, >30 minutes before breakfast, separated by >4 hours from acid reflux medications, calcium, iron, multivitamins. Pt. is taking it  correctly. - will check thyroid tests today: TSH and fT4 - If labs are abnormal, she will need to return for repeat TFTs in 1.5 months  2. PCOS -Patient with history of PCOS and several symptoms consistent with this condition including weight gain, fatigue, history of irregular menstrual cycles (she had increased bleeding) and decreased fertility. -She is currently on metformin 1000 mg daily.  We could not increase the dose due to previous intolerance.  I refilled this for her today. -She also continues on spironolactone 100 mg daily.  No dizziness or orthostasis.  Discussed again about the need for contraception and to stop spironolactone if she plans to try for pregnancy.  We will check a CMP today. -She is also on NuvaRing.  She is wondering whether she should stay on this.  Due to previous increased bleeding on OCPs and also without estrogen treatment, for now, I recommended to continue it. -We checked an HbA1c at today's visit and this was lower, at 4.9%. -She complains about gaining weight-she gained 19 pounds since our last in person visit.  In the last 3 months, she tried to reduce her food intake but she did not lose weight, she indeed gained.  At this visit, I suggested the colon management clinic, but she usually drives for 45 minutes away and this may be a problem for her.  I am not sure whether she could have virtual appointments.  We also discussed about the possibility of gastric bypass surgery.  I advised her to think about gastric sleeve as this has less complications than the other gastric bypass options. -At today's visit, we will check: Orders Placed This Encounter  Procedures   TSH   T4, free   Comprehensive metabolic panel   Lipid panel   VITAMIN D 25 Hydroxy (Vit-D Deficiency, Fractures)   Thyroglobulin antibody   Thyroid peroxidase  antibody   Amb Ref to Medical Weight Management   POCT glycosylated hemoglobin (Hb A1C)  - will see her back in 1 year  Needs refills  LT4.  Component     Latest Ref Rng & Units 10/14/2020  Sodium     135 - 145 mEq/L 138  Potassium     3.5 - 5.1 mEq/L 3.8  Chloride     96 - 112 mEq/L 104  CO2     19 - 32 mEq/L 23  Glucose     70 - 99 mg/dL 79  BUN     6 - 23 mg/dL 8  Creatinine     8.65 - 1.20 mg/dL 7.84  Total Bilirubin     0.2 - 1.2 mg/dL 0.4  Alkaline Phosphatase     39 - 117 U/L 53  AST     0 - 37 U/L 12  ALT     0 - 35 U/L 11  Total Protein     6.0 - 8.3 g/dL 7.6  Albumin     3.5 - 5.2 g/dL 3.9  GFR     >69.62 mL/min 97.55  Calcium     8.4 - 10.5 mg/dL 9.2  Cholesterol     0 - 200 mg/dL 952  Triglycerides     0.0 - 149.0 mg/dL 841.3 (H)  HDL Cholesterol     >39.00 mg/dL 24.40  VLDL     0.0 - 10.2 mg/dL 72.5  LDL (calc)     0 - 99 mg/dL 366 (H)  Total CHOL/HDL Ratio      4  NonHDL      143.70  T4,Free(Direct)     0.60 - 1.60 ng/dL 4.40  TSH     3.47 - 4.25 uIU/mL 11.06 (H)  Thyroglobulin Ab     < or = 1 IU/mL 3 (H)  Thyroperoxidase Ab SerPl-aCnc     <9 IU/mL 207 (H)  Hemoglobin A1C     4.0 - 5.6 % 4.9  VITD     30.00 - 100.00 ng/mL 33.82   Thyroid antibodies are not much changed compared to before.  I will advise her that she can stop Selenium if she prefers. Vitamin D is normal. LDL is only slightly high and triglycerides are higher, but this was not a fasting sample. TSH is quite high, which is surprising, since she had a normal TSH level on this dose before.  Says she mentioned that she was taking the levothyroxine dose correctly, and also due to her history of lactose intolerance, we will increase the dose to 137 mcg daily and recheck the tests in 1.5 months.  Carlus Pavlov, MD PhD [facility]

## 2020-10-14 NOTE — Patient Instructions (Signed)
Please continue: - Metformin 1000 mg with dinner - Spironolactone 100 mg at bedtime  Please come back for a follow-up appointment in 1 year, but please come at your convenience for labs.

## 2020-10-15 LAB — THYROID PEROXIDASE ANTIBODY: Thyroperoxidase Ab SerPl-aCnc: 207 IU/mL — ABNORMAL HIGH (ref <9)

## 2020-10-15 LAB — THYROGLOBULIN ANTIBODY: Thyroglobulin Ab: 3 IU/mL — ABNORMAL HIGH (ref <=1)

## 2020-10-16 MED ORDER — LEVOTHYROXINE SODIUM 137 MCG PO TABS: ORAL_TABLET | ORAL | 5 refills | Status: DC

## 2020-10-27 ENCOUNTER — Other Ambulatory Visit: Payer: Self-pay | Admitting: Internal Medicine

## 2020-10-27 DIAGNOSIS — E282 Polycystic ovarian syndrome: Secondary | ICD-10-CM

## 2020-10-28 ENCOUNTER — Encounter: Payer: Self-pay | Admitting: Internal Medicine

## 2020-11-05 DIAGNOSIS — Z791 Long term (current) use of non-steroidal anti-inflammatories (NSAID): Secondary | ICD-10-CM | POA: Diagnosis not present

## 2020-11-05 DIAGNOSIS — M797 Fibromyalgia: Secondary | ICD-10-CM | POA: Diagnosis not present

## 2020-11-13 ENCOUNTER — Other Ambulatory Visit: Payer: Self-pay | Admitting: Internal Medicine

## 2020-11-13 DIAGNOSIS — E063 Autoimmune thyroiditis: Secondary | ICD-10-CM

## 2020-11-13 DIAGNOSIS — M542 Cervicalgia: Secondary | ICD-10-CM

## 2020-11-13 DIAGNOSIS — E038 Other specified hypothyroidism: Secondary | ICD-10-CM

## 2020-11-26 ENCOUNTER — Ambulatory Visit
Admission: RE | Admit: 2020-11-26 | Discharge: 2020-11-26 | Disposition: A | Discharge status: Home / Self Care | Payer: BC Managed Care – PPO | Source: Ambulatory Visit | Attending: Internal Medicine | Admitting: Internal Medicine

## 2020-11-26 DIAGNOSIS — M542 Cervicalgia: Secondary | ICD-10-CM

## 2020-11-26 DIAGNOSIS — E063 Autoimmune thyroiditis: Secondary | ICD-10-CM | POA: Diagnosis not present

## 2020-11-26 DIAGNOSIS — E038 Other specified hypothyroidism: Secondary | ICD-10-CM

## 2020-12-16 DIAGNOSIS — J019 Acute sinusitis, unspecified: Secondary | ICD-10-CM | POA: Diagnosis not present

## 2021-01-11 DIAGNOSIS — R519 Headache, unspecified: Secondary | ICD-10-CM | POA: Diagnosis not present

## 2021-01-11 DIAGNOSIS — R051 Acute cough: Secondary | ICD-10-CM | POA: Diagnosis not present

## 2021-01-11 DIAGNOSIS — Z20822 Contact with and (suspected) exposure to covid-19: Secondary | ICD-10-CM | POA: Diagnosis not present

## 2021-01-11 DIAGNOSIS — H9203 Otalgia, bilateral: Secondary | ICD-10-CM | POA: Diagnosis not present

## 2021-02-05 DIAGNOSIS — Z1159 Encounter for screening for other viral diseases: Secondary | ICD-10-CM | POA: Diagnosis not present

## 2021-02-05 DIAGNOSIS — Z0000 Encounter for general adult medical examination without abnormal findings: Secondary | ICD-10-CM | POA: Diagnosis not present

## 2021-03-02 DIAGNOSIS — R221 Localized swelling, mass and lump, neck: Secondary | ICD-10-CM | POA: Diagnosis not present

## 2021-04-02 ENCOUNTER — Other Ambulatory Visit: Payer: Self-pay | Admitting: Internal Medicine

## 2021-04-27 DIAGNOSIS — M797 Fibromyalgia: Secondary | ICD-10-CM | POA: Diagnosis not present

## 2021-04-27 DIAGNOSIS — Z791 Long term (current) use of non-steroidal anti-inflammatories (NSAID): Secondary | ICD-10-CM | POA: Diagnosis not present

## 2021-05-19 DIAGNOSIS — R062 Wheezing: Secondary | ICD-10-CM | POA: Diagnosis not present

## 2021-05-19 DIAGNOSIS — J209 Acute bronchitis, unspecified: Secondary | ICD-10-CM | POA: Diagnosis not present

## 2021-05-19 DIAGNOSIS — Z20822 Contact with and (suspected) exposure to covid-19: Secondary | ICD-10-CM | POA: Diagnosis not present

## 2021-05-19 DIAGNOSIS — R051 Acute cough: Secondary | ICD-10-CM | POA: Diagnosis not present

## 2021-06-24 DIAGNOSIS — D239 Other benign neoplasm of skin, unspecified: Secondary | ICD-10-CM | POA: Diagnosis not present

## 2021-06-24 DIAGNOSIS — L570 Actinic keratosis: Secondary | ICD-10-CM | POA: Diagnosis not present

## 2021-07-08 DIAGNOSIS — L570 Actinic keratosis: Secondary | ICD-10-CM | POA: Diagnosis not present

## 2021-07-16 DIAGNOSIS — Z791 Long term (current) use of non-steroidal anti-inflammatories (NSAID): Secondary | ICD-10-CM | POA: Diagnosis not present

## 2021-07-16 DIAGNOSIS — M797 Fibromyalgia: Secondary | ICD-10-CM | POA: Diagnosis not present

## 2021-07-22 DIAGNOSIS — E039 Hypothyroidism, unspecified: Secondary | ICD-10-CM | POA: Diagnosis not present

## 2021-07-24 DIAGNOSIS — E039 Hypothyroidism, unspecified: Secondary | ICD-10-CM | POA: Diagnosis not present

## 2021-07-24 DIAGNOSIS — E063 Autoimmune thyroiditis: Secondary | ICD-10-CM | POA: Diagnosis not present

## 2021-08-31 DIAGNOSIS — E063 Autoimmune thyroiditis: Secondary | ICD-10-CM | POA: Diagnosis not present

## 2021-09-04 DIAGNOSIS — U071 COVID-19: Secondary | ICD-10-CM | POA: Diagnosis not present

## 2021-09-04 DIAGNOSIS — E039 Hypothyroidism, unspecified: Secondary | ICD-10-CM | POA: Diagnosis not present

## 2021-09-04 DIAGNOSIS — D351 Benign neoplasm of parathyroid gland: Secondary | ICD-10-CM | POA: Diagnosis not present

## 2021-09-21 DIAGNOSIS — E063 Autoimmune thyroiditis: Secondary | ICD-10-CM | POA: Diagnosis not present

## 2021-10-06 ENCOUNTER — Other Ambulatory Visit: Payer: Self-pay | Admitting: Internal Medicine

## 2021-10-07 DIAGNOSIS — M70962 Unspecified soft tissue disorder related to use, overuse and pressure, left lower leg: Secondary | ICD-10-CM | POA: Diagnosis not present

## 2021-10-07 DIAGNOSIS — J029 Acute pharyngitis, unspecified: Secondary | ICD-10-CM | POA: Diagnosis not present

## 2021-10-16 ENCOUNTER — Ambulatory Visit: Payer: BC Managed Care – PPO | Admitting: Internal Medicine

## 2021-10-22 ENCOUNTER — Other Ambulatory Visit: Payer: Self-pay | Admitting: Internal Medicine

## 2021-10-22 DIAGNOSIS — E282 Polycystic ovarian syndrome: Secondary | ICD-10-CM

## 2021-11-03 DIAGNOSIS — E063 Autoimmune thyroiditis: Secondary | ICD-10-CM | POA: Diagnosis not present

## 2021-11-05 DIAGNOSIS — E282 Polycystic ovarian syndrome: Secondary | ICD-10-CM | POA: Diagnosis not present

## 2021-11-05 DIAGNOSIS — E039 Hypothyroidism, unspecified: Secondary | ICD-10-CM | POA: Diagnosis not present

## 2021-11-05 DIAGNOSIS — L659 Nonscarring hair loss, unspecified: Secondary | ICD-10-CM | POA: Diagnosis not present

## 2021-12-04 ENCOUNTER — Other Ambulatory Visit: Payer: Self-pay | Admitting: Internal Medicine

## 2021-12-04 DIAGNOSIS — E282 Polycystic ovarian syndrome: Secondary | ICD-10-CM

## 2021-12-22 DIAGNOSIS — Z3044 Encounter for surveillance of vaginal ring hormonal contraceptive device: Secondary | ICD-10-CM | POA: Diagnosis not present

## 2021-12-22 DIAGNOSIS — Z01419 Encounter for gynecological examination (general) (routine) without abnormal findings: Secondary | ICD-10-CM | POA: Diagnosis not present

## 2021-12-22 DIAGNOSIS — N920 Excessive and frequent menstruation with regular cycle: Secondary | ICD-10-CM | POA: Diagnosis not present

## 2021-12-22 DIAGNOSIS — N946 Dysmenorrhea, unspecified: Secondary | ICD-10-CM | POA: Diagnosis not present

## 2021-12-29 DIAGNOSIS — N946 Dysmenorrhea, unspecified: Secondary | ICD-10-CM | POA: Diagnosis not present

## 2021-12-29 DIAGNOSIS — N920 Excessive and frequent menstruation with regular cycle: Secondary | ICD-10-CM | POA: Diagnosis not present

## 2022-01-01 ENCOUNTER — Other Ambulatory Visit: Payer: Self-pay | Admitting: Internal Medicine

## 2022-01-01 DIAGNOSIS — E282 Polycystic ovarian syndrome: Secondary | ICD-10-CM

## 2022-01-06 DIAGNOSIS — N920 Excessive and frequent menstruation with regular cycle: Secondary | ICD-10-CM | POA: Diagnosis not present

## 2022-01-06 DIAGNOSIS — N946 Dysmenorrhea, unspecified: Secondary | ICD-10-CM | POA: Diagnosis not present

## 2022-01-07 DIAGNOSIS — Z791 Long term (current) use of non-steroidal anti-inflammatories (NSAID): Secondary | ICD-10-CM | POA: Diagnosis not present

## 2022-01-07 DIAGNOSIS — M797 Fibromyalgia: Secondary | ICD-10-CM | POA: Diagnosis not present

## 2022-01-27 ENCOUNTER — Other Ambulatory Visit: Payer: Self-pay | Admitting: Internal Medicine

## 2022-01-27 DIAGNOSIS — E282 Polycystic ovarian syndrome: Secondary | ICD-10-CM

## 2022-02-03 ENCOUNTER — Other Ambulatory Visit: Payer: Self-pay | Admitting: Internal Medicine

## 2022-02-03 DIAGNOSIS — E282 Polycystic ovarian syndrome: Secondary | ICD-10-CM

## 2022-02-05 DIAGNOSIS — E039 Hypothyroidism, unspecified: Secondary | ICD-10-CM | POA: Diagnosis not present

## 2022-02-05 DIAGNOSIS — E282 Polycystic ovarian syndrome: Secondary | ICD-10-CM | POA: Diagnosis not present

## 2022-02-05 DIAGNOSIS — Z6841 Body Mass Index (BMI) 40.0 and over, adult: Secondary | ICD-10-CM | POA: Diagnosis not present

## 2022-02-11 DIAGNOSIS — N920 Excessive and frequent menstruation with regular cycle: Secondary | ICD-10-CM | POA: Diagnosis not present

## 2022-03-05 DIAGNOSIS — N920 Excessive and frequent menstruation with regular cycle: Secondary | ICD-10-CM | POA: Diagnosis not present

## 2022-03-08 DIAGNOSIS — N879 Dysplasia of cervix uteri, unspecified: Secondary | ICD-10-CM | POA: Diagnosis not present

## 2022-03-08 DIAGNOSIS — Z6841 Body Mass Index (BMI) 40.0 and over, adult: Secondary | ICD-10-CM | POA: Diagnosis not present

## 2022-03-08 DIAGNOSIS — N920 Excessive and frequent menstruation with regular cycle: Secondary | ICD-10-CM | POA: Diagnosis not present

## 2022-03-08 DIAGNOSIS — M797 Fibromyalgia: Secondary | ICD-10-CM | POA: Diagnosis not present

## 2022-03-19 DIAGNOSIS — Z3044 Encounter for surveillance of vaginal ring hormonal contraceptive device: Secondary | ICD-10-CM | POA: Diagnosis not present

## 2022-03-19 DIAGNOSIS — N920 Excessive and frequent menstruation with regular cycle: Secondary | ICD-10-CM | POA: Diagnosis not present

## 2022-04-08 DIAGNOSIS — M797 Fibromyalgia: Secondary | ICD-10-CM | POA: Diagnosis not present

## 2022-04-08 DIAGNOSIS — Z791 Long term (current) use of non-steroidal anti-inflammatories (NSAID): Secondary | ICD-10-CM | POA: Diagnosis not present

## 2022-06-15 DIAGNOSIS — J0100 Acute maxillary sinusitis, unspecified: Secondary | ICD-10-CM | POA: Diagnosis not present

## 2022-06-30 DIAGNOSIS — E039 Hypothyroidism, unspecified: Secondary | ICD-10-CM | POA: Diagnosis not present

## 2022-06-30 DIAGNOSIS — Z6841 Body Mass Index (BMI) 40.0 and over, adult: Secondary | ICD-10-CM | POA: Diagnosis not present

## 2022-06-30 DIAGNOSIS — Z0000 Encounter for general adult medical examination without abnormal findings: Secondary | ICD-10-CM | POA: Diagnosis not present

## 2022-06-30 DIAGNOSIS — M797 Fibromyalgia: Secondary | ICD-10-CM | POA: Diagnosis not present

## 2022-06-30 DIAGNOSIS — E282 Polycystic ovarian syndrome: Secondary | ICD-10-CM | POA: Diagnosis not present

## 2022-09-07 DIAGNOSIS — M797 Fibromyalgia: Secondary | ICD-10-CM | POA: Diagnosis not present

## 2022-09-07 DIAGNOSIS — Z791 Long term (current) use of non-steroidal anti-inflammatories (NSAID): Secondary | ICD-10-CM | POA: Diagnosis not present

## 2022-09-17 DIAGNOSIS — M797 Fibromyalgia: Secondary | ICD-10-CM | POA: Diagnosis not present

## 2022-09-17 DIAGNOSIS — E039 Hypothyroidism, unspecified: Secondary | ICD-10-CM | POA: Diagnosis not present

## 2022-09-17 DIAGNOSIS — Z6841 Body Mass Index (BMI) 40.0 and over, adult: Secondary | ICD-10-CM | POA: Diagnosis not present

## 2022-09-20 DIAGNOSIS — E039 Hypothyroidism, unspecified: Secondary | ICD-10-CM | POA: Diagnosis not present

## 2022-09-20 DIAGNOSIS — M797 Fibromyalgia: Secondary | ICD-10-CM | POA: Diagnosis not present

## 2022-10-06 ENCOUNTER — Other Ambulatory Visit: Payer: Self-pay | Admitting: Internal Medicine

## 2022-10-20 ENCOUNTER — Other Ambulatory Visit: Payer: Self-pay | Admitting: Internal Medicine

## 2022-11-30 ENCOUNTER — Other Ambulatory Visit: Payer: Self-pay | Admitting: Internal Medicine

## 2022-11-30 DIAGNOSIS — M25561 Pain in right knee: Secondary | ICD-10-CM | POA: Diagnosis not present

## 2022-11-30 DIAGNOSIS — M2241 Chondromalacia patellae, right knee: Secondary | ICD-10-CM | POA: Diagnosis not present

## 2022-11-30 DIAGNOSIS — E282 Polycystic ovarian syndrome: Secondary | ICD-10-CM

## 2022-12-13 DIAGNOSIS — M25561 Pain in right knee: Secondary | ICD-10-CM | POA: Diagnosis not present

## 2022-12-27 DIAGNOSIS — M25561 Pain in right knee: Secondary | ICD-10-CM | POA: Diagnosis not present

## 2023-01-03 DIAGNOSIS — M25561 Pain in right knee: Secondary | ICD-10-CM | POA: Diagnosis not present

## 2023-03-31 IMAGING — US US THYROID
1 series · 14 of 25 positions shown · non-contrast
Comparison: None.

CLINICAL DATA: Worsening neck compression

Hashimoto's thyroiditis
EXAM:
THYROID ULTRASOUND
TECHNIQUE: Ultrasound examination of the thyroid gland and adjacent soft
tissues was performed.

[Series 1: us thyroid · 0.05mm/px · 14 of 47 slices shown]
[im 1/47]
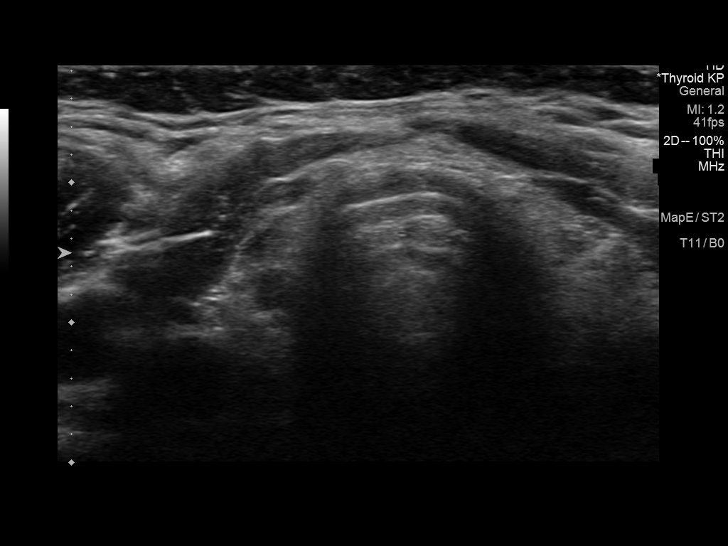
[im 4/47]
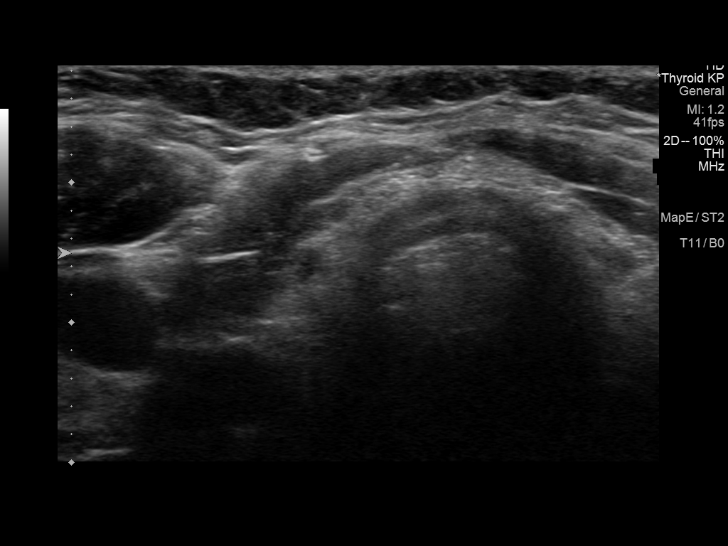
[im 8/47]
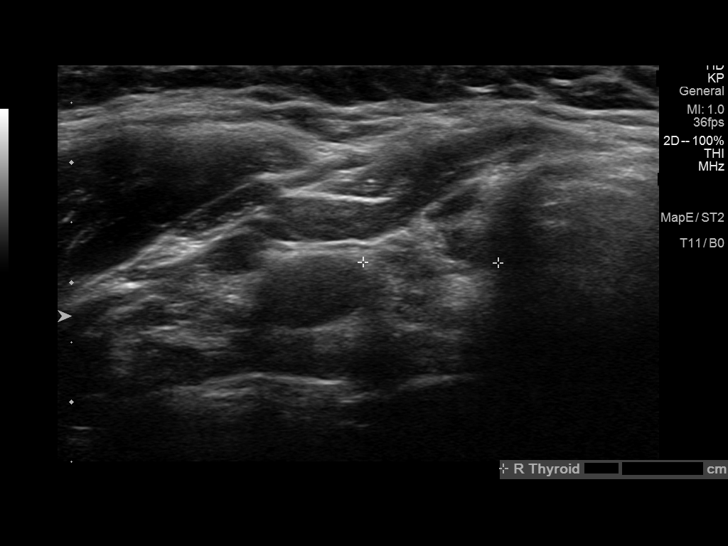
[im 12/47]
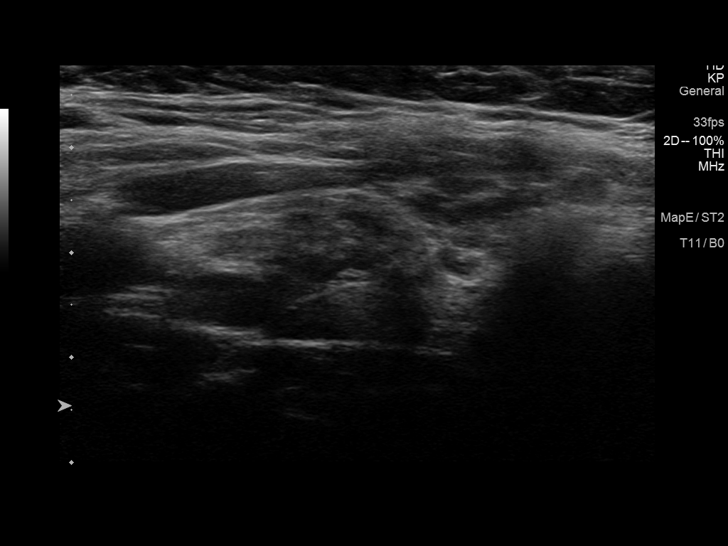
[im 16/47]
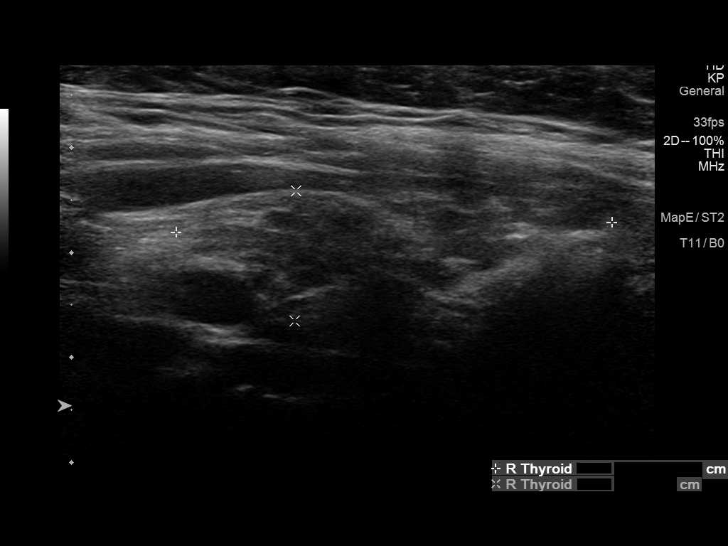
[im 18/47]
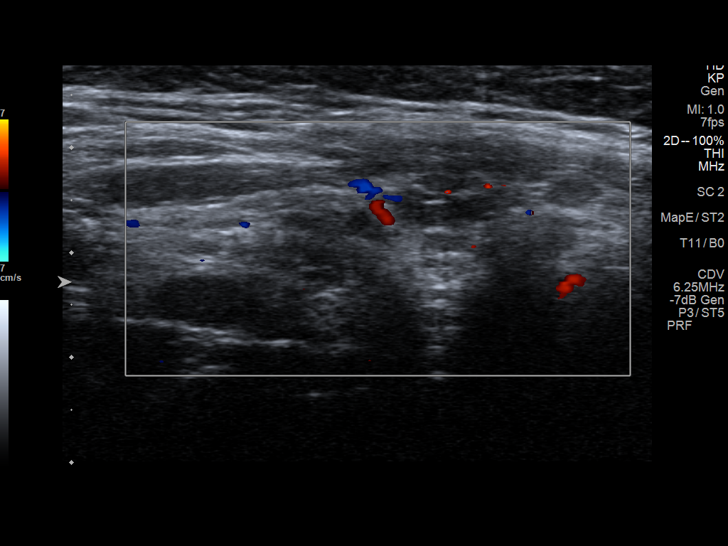
[im 22/47]
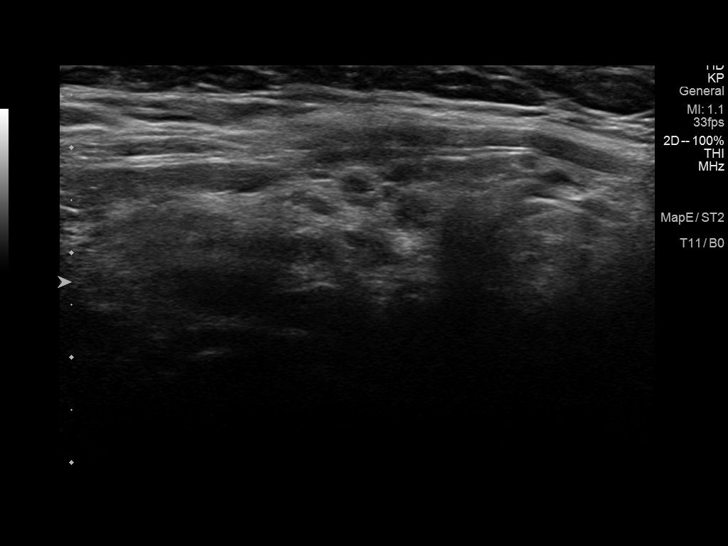
[im 25/47]
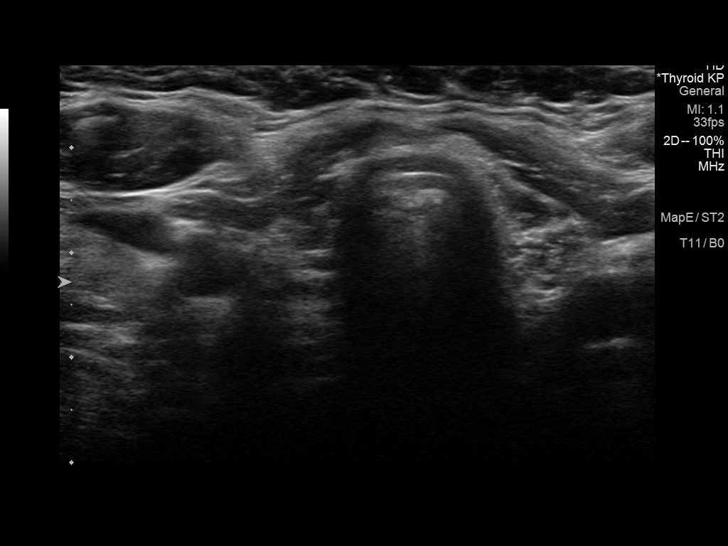
[im 29/47]
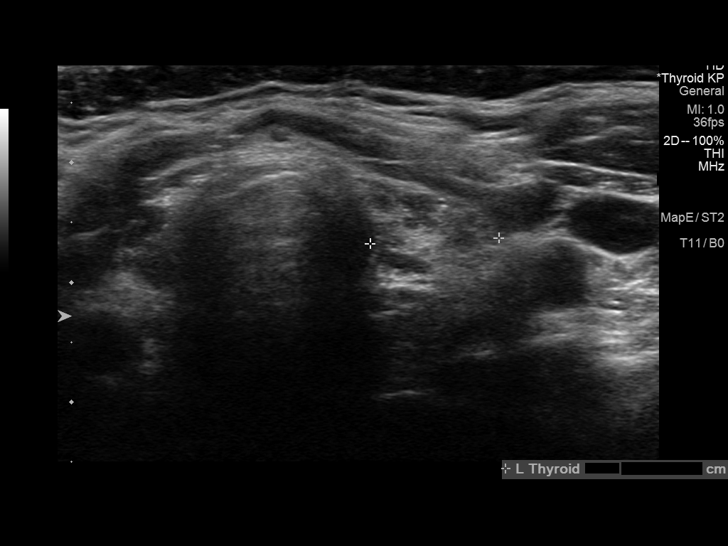
[im 31/47]
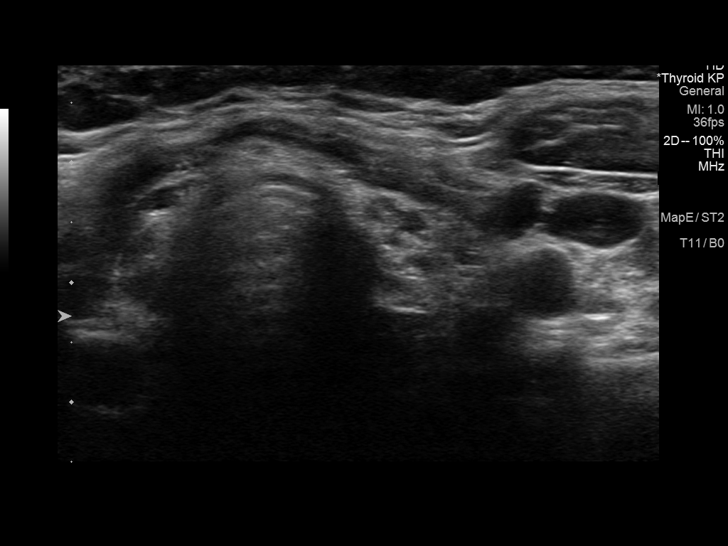
[im 35/47]
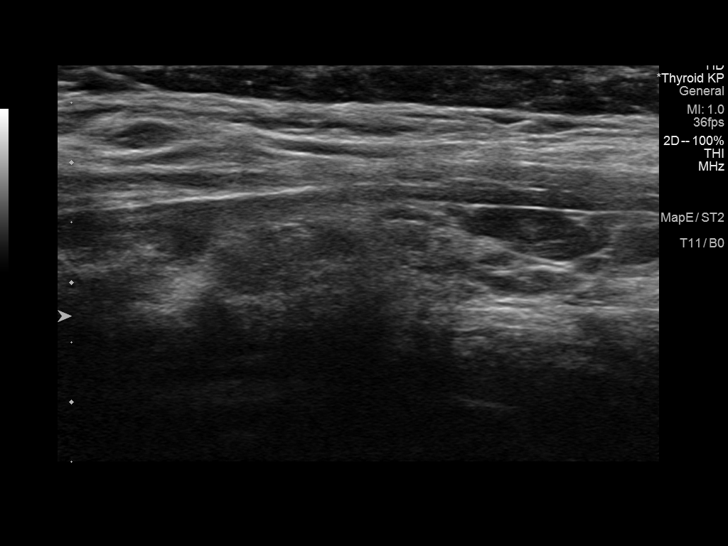
[im 39/47]
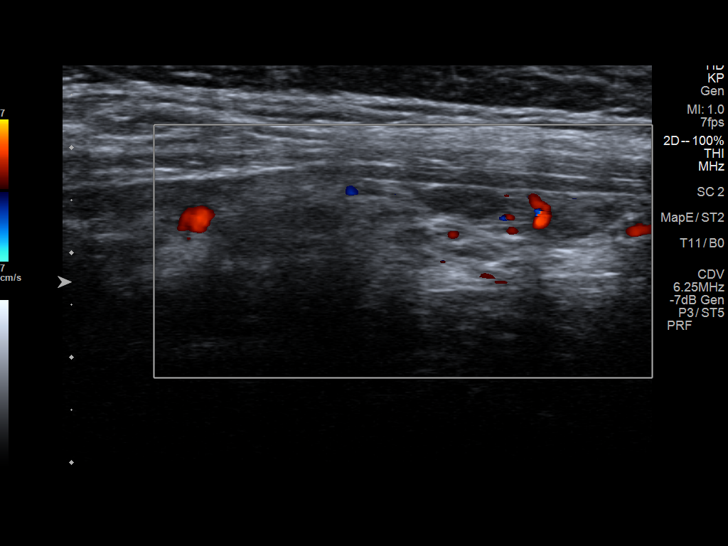
[im 43/47]
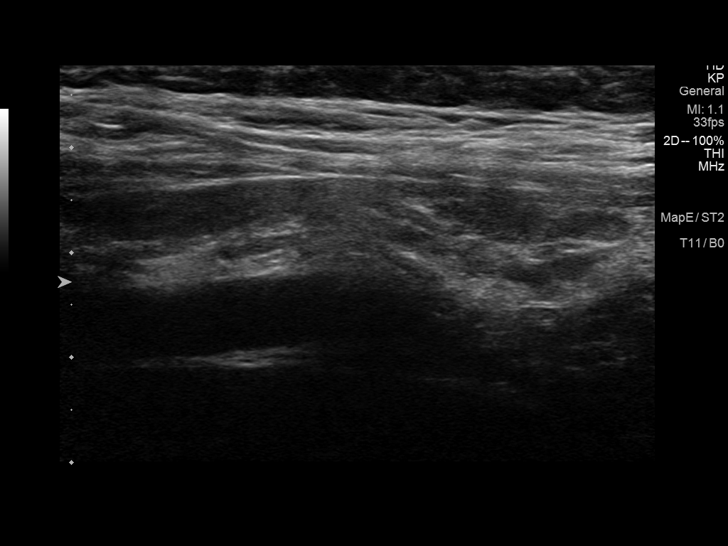
[im 47/47]
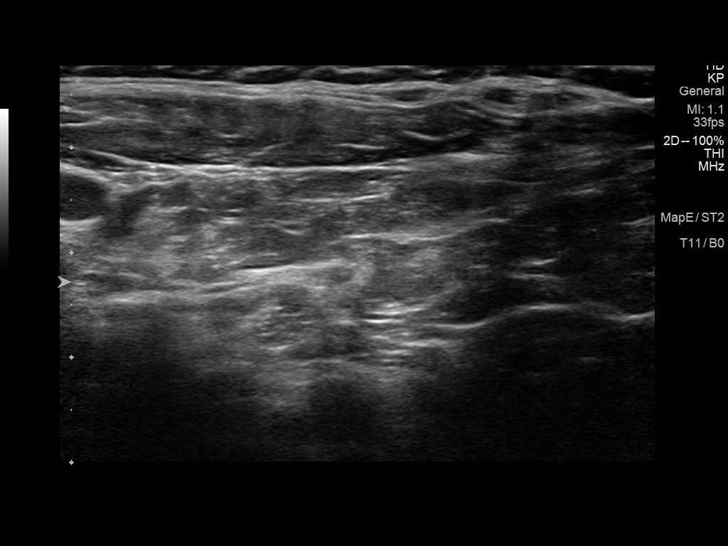

[14 of 25 positions shown; findings below may reference images not displayed]

FINDINGS: Parenchymal Echotexture: Moderately heterogeneous

Isthmus: 0.3 cm

Right lobe: 4.2 x 1.2 x 1.1 cm

Left lobe: 4.4 x 1.1 x 1.1 cm

_________________________________________________________

Estimated total number of nodules >/= 1 cm: 0

Number of spongiform nodules >/=  2 cm not described below (TR1): 0

Number of mixed cystic and solid nodules >/= 1.5 cm not described
below (TR2): 0

_________________________________________________________

No discrete nodules are seen within the thyroid gland.
IMPRESSION: Diffusely heterogeneous shrunken thyroid without discrete nodule.

The above is in keeping with the ACR TI-RADS recommendations - [HOSPITAL] 7568;[DATE].
